# Patient Record
Sex: Female | Born: 1965 | Race: White | Hispanic: No | Smoking: Former smoker
Health system: Southern US, Community
[De-identification: ages and names within clinical notes are randomized; demographics above are authoritative.]

## PROBLEM LIST (undated history)

## (undated) DIAGNOSIS — I4891 Unspecified atrial fibrillation: Secondary | ICD-10-CM

## (undated) HISTORY — PX: TUBAL LIGATION: SHX77

## (undated) HISTORY — PX: CARDIAC ELECTROPHYSIOLOGY MAPPING AND ABLATION: SHX1292

---

## 2016-11-07 ENCOUNTER — Emergency Department (HOSPITAL_BASED_OUTPATIENT_CLINIC_OR_DEPARTMENT_OTHER)
Admission: EM | Admit: 2016-11-07 | Discharge: 2016-11-08 | Disposition: A | Discharge status: Home / Self Care | Payer: 59 | Attending: Emergency Medicine | Admitting: Emergency Medicine

## 2016-11-07 ENCOUNTER — Encounter (HOSPITAL_BASED_OUTPATIENT_CLINIC_OR_DEPARTMENT_OTHER): Payer: Self-pay | Admitting: Emergency Medicine

## 2016-11-07 ENCOUNTER — Emergency Department (HOSPITAL_BASED_OUTPATIENT_CLINIC_OR_DEPARTMENT_OTHER): Payer: 59

## 2016-11-07 DIAGNOSIS — R0789 Other chest pain: Secondary | ICD-10-CM | POA: Insufficient documentation

## 2016-11-07 DIAGNOSIS — Z87891 Personal history of nicotine dependence: Secondary | ICD-10-CM | POA: Diagnosis not present

## 2016-11-07 DIAGNOSIS — R079 Chest pain, unspecified: Secondary | ICD-10-CM

## 2016-11-07 HISTORY — DX: Unspecified atrial fibrillation: I48.91

## 2016-11-07 LAB — BASIC METABOLIC PANEL
Anion gap: 8 (ref 5–15)
BUN: 15 mg/dL (ref 6–20)
CO2: 26 mmol/L (ref 22–32)
CREATININE: 0.89 mg/dL (ref 0.44–1.00)
Calcium: 9 mg/dL (ref 8.9–10.3)
Chloride: 103 mmol/L (ref 101–111)
GFR calc Af Amer: >60 mL/min (ref >60)
GFR calc non Af Amer: >60 mL/min (ref >60)
GLUCOSE: 111 mg/dL — AB (ref 65–99)
POTASSIUM: 4 mmol/L (ref 3.5–5.1)
Sodium: 137 mmol/L (ref 135–145)

## 2016-11-07 LAB — TROPONIN I
Troponin I: <0.03 ng/mL (ref <0.03)
Troponin I: <0.03 ng/mL (ref <0.03)

## 2016-11-07 LAB — CBC
HEMATOCRIT: 41.7 % (ref 36.0–46.0)
Hemoglobin: 13.4 g/dL (ref 12.0–15.0)
MCH: 30 pg (ref 26.0–34.0)
MCHC: 32.1 g/dL (ref 30.0–36.0)
MCV: 93.3 fL (ref 78.0–100.0)
PLATELETS: 363 10*3/uL (ref 150–400)
RBC: 4.47 MIL/uL (ref 3.87–5.11)
RDW: 12.3 % (ref 11.5–15.5)
WBC: 8.1 10*3/uL (ref 4.0–10.5)

## 2016-11-07 MED ORDER — NITROGLYCERIN 0.4 MG SL SUBL
0.4000 mg | SUBLINGUAL_TABLET | SUBLINGUAL | Status: DC | PRN
  Administered 2016-11-07 (×2): 0.4 mg via SUBLINGUAL
  Filled 2016-11-07: qty 1

## 2016-11-07 MED ORDER — GI COCKTAIL ~~LOC~~
30.0000 mL | Freq: Once | ORAL | Status: AC
  Administered 2016-11-08: 30 mL via ORAL
  Filled 2016-11-07: qty 30

## 2016-11-07 MED ORDER — KETOROLAC TROMETHAMINE 60 MG/2ML IM SOLN: 60.0000 mg | Freq: Once | INTRAMUSCULAR | Status: DC

## 2016-11-07 MED ORDER — KETOROLAC TROMETHAMINE 30 MG/ML IJ SOLN
30.0000 mg | Freq: Once | INTRAMUSCULAR | Status: AC
  Administered 2016-11-07: 30 mg via INTRAVENOUS
  Filled 2016-11-07: qty 1

## 2016-11-07 NOTE — ED Provider Notes (Signed)
[facility] Provider Note   CSN: [id number] Arrival date & time: 11/07/16  1712  By signing my name below, I, Javier Docker, attest that this documentation has been prepared under the direction and in the presence of Rolan Bucco, MD. Electronically Signed: Javier Docker, ER Scribe. 05/30/2016. 9:08 PM.  History   Chief Complaint Chief Complaint  Patient presents with  . Chest Pain   The history is provided by the patient. No language interpreter was used.    HPI Comments: Jasmin Sullivan is a 51 y.o. female who presents to the Emergency Department complaining of CP with associated diffuse chest tightness and dizziness. She denies SOB, nausea, calf pain, recent surgery, long car rides/travel, hx of blood clots, vomiting or abdominal pain. She had one event of the same sx last weekend. She has a PMHx of cardiac ablation for afib. Nothing makes her sx better or worse. She denies cough, chest congestion or other cold sx. Her PCP is Dr. Julius Bowels. She has a past smoking hx but quit three years ago. She has NKDA. At last physical her cholesterol was slightly elevated.  No family hx of heart dz.   Past Medical History:  Diagnosis Date  . A-fib (HCC)     There are no active problems to display for this patient.   Past Surgical History:  Procedure Laterality Date  . CARDIAC ELECTROPHYSIOLOGY MAPPING AND ABLATION    . TUBAL LIGATION      OB History    No data available       Home Medications    Prior to Admission medications   Medication Sig Start Date End Date Taking? Authorizing Provider  buPROPion (WELLBUTRIN XL) 300 MG 24 hr tablet Take 300 mg by mouth daily.   Yes Historical Provider, MD    Family History History reviewed. No pertinent family history.  Social History Social History  Substance Use Topics  . Smoking status: Former Games developer  . Smokeless tobacco: Never Used  . Alcohol use 1.2 oz/week    2 Glasses of wine per week     Allergies   Patient  has no known allergies.   Review of Systems Review of Systems  Constitutional: Negative for chills, diaphoresis, fatigue and fever.  HENT: Negative for congestion, rhinorrhea and sneezing.   Eyes: Negative.   Respiratory: Positive for chest tightness. Negative for cough and shortness of breath.   Cardiovascular: Positive for chest pain. Negative for leg swelling.  Gastrointestinal: Negative for abdominal pain, blood in stool, diarrhea, nausea and vomiting.  Genitourinary: Negative for difficulty urinating, flank pain, frequency and hematuria.  Musculoskeletal: Negative for arthralgias and back pain.  Skin: Negative for rash.  Neurological: Negative for dizziness, speech difficulty, weakness, numbness and headaches.     Physical Exam Updated Vital Signs BP 126/83   Pulse (!) 56   Temp 98.2 F (36.8 C) (Oral)   Resp 12   Ht 5\' 9"  (1.753 m)   Wt 192 lb (87.1 kg)   SpO2 97%   BMI 28.35 kg/m   Physical Exam  Constitutional: She is oriented to person, place, and time. She appears well-developed and well-nourished.  HENT:  Head: Normocephalic and atraumatic.  Eyes: Pupils are equal, round, and reactive to light.  Neck: Normal range of motion. Neck supple.  Cardiovascular: Normal rate, regular rhythm and normal heart sounds.   Pulmonary/Chest: Effort normal and breath sounds normal. No respiratory distress. She has no wheezes. She has no rales. She exhibits tenderness.  Mild  TTP in left chest wall. No lower extremity TTP or edema.  Abdominal: Soft. Bowel sounds are normal. There is no tenderness. There is no rebound and no guarding.  Musculoskeletal: Normal range of motion. She exhibits no edema.  Lymphadenopathy:    She has no cervical adenopathy.  Neurological: She is alert and oriented to person, place, and time.  Skin: Skin is warm and dry. No rash noted.  Psychiatric: She has a normal mood and affect.     ED Treatments / Results  DIAGNOSTIC STUDIES: Oxygen Saturation  is 100% on RA, normal by my interpretation.    COORDINATION OF CARE: 9:08 PM Discussed treatment plan with pt at bedside and pt agreed to plan.  Labs (all labs ordered are listed, but only abnormal results are displayed) Labs Reviewed  BASIC METABOLIC PANEL - Abnormal; Notable for the following:       Result Value   Glucose, Bld 111 (*)    All other components within normal limits  CBC  TROPONIN I  TROPONIN I    EKG  EKG Interpretation  Date/Time:  Saturday November 07 2016 17:22:45 EST Ventricular Rate:  72 PR Interval:  152 QRS Duration: 90 QT Interval:  382 QTC Calculation: 418 R Axis:   -29 Text Interpretation:  Normal sinus rhythm Cannot rule out Anterior infarct , age undetermined Abnormal ECG No old tracing to compare Confirmed by Cree Kunert  MD, Nelle Sayed ([id number]) on 11/07/2016 5:42:39 PM       Radiology Dg Chest 2 View  Result Date: 11/07/2016 CLINICAL DATA:  Chest pain EXAM: CHEST  2 VIEW COMPARISON:  None. FINDINGS: The heart size and mediastinal contours are within normal limits. Both lungs are clear. The visualized skeletal structures are unremarkable. IMPRESSION: No active cardiopulmonary disease. Electronically Signed   By: Jasmine PangKim  Fujinaga M.D.   On: 11/07/2016 20:02    Procedures Procedures (including critical care time)  Medications Ordered in ED Medications  nitroGLYCERIN (NITROSTAT) SL tablet 0.4 mg (0.4 mg Sublingual Given 11/07/16 2336)  ketorolac (TORADOL) 30 MG/ML injection 30 mg (30 mg Intravenous Given 11/07/16 2223)  gi cocktail (Maalox,Lidocaine,Donnatal) (30 mLs Oral Given 11/08/16 0003)     Initial Impression / Assessment and Plan / ED Course  I have reviewed the triage vital signs and the nursing notes.  Pertinent labs & imaging results that were available during my care of the patient were reviewed by me and considered in my medical decision making (see chart for details).     Patient presents with chest pain. It is reproducible on palpation.  She has no ischemic changes on EKG. She's had 2 negative troponins. She has a heart score of 3. There is no symptoms that would be more suggestive of pulmonary embolus. No evidence of pneumonia or pneumothorax. She was discharged home in good condition. She was encouraged to have close follow-up with her PCP for further cardiac evaluation. Return precautions were given.  Final Clinical Impressions(s) / ED Diagnoses   Final diagnoses:  Nonspecific chest pain    New Prescriptions New Prescriptions   No medications on file      I personally performed the services described in this documentation, which was scribed in my presence.  The recorded information has been reviewed and considered.       Rolan BuccoMelanie Aroldo Galli, MD 11/08/16 [phone]

## 2016-11-07 NOTE — ED Notes (Signed)
Pt alert, NAD, calm, interactive, resps e/u, speaking in clear complete sentences, no dyspnea noted[facility]

## 2016-11-07 NOTE — ED Triage Notes (Signed)
Patient states that she started to have chest pain about an hour and half ago, with some dizziness. Patient reports that she has a history of ablation in the past for A.fib

## 2017-12-05 IMAGING — CR DG CHEST 2V
2 series · 2 of 2 positions shown · non-contrast
Comparison: None.

CLINICAL DATA: Chest pain

EXAM:
CHEST  2 VIEW

[w chest pa]
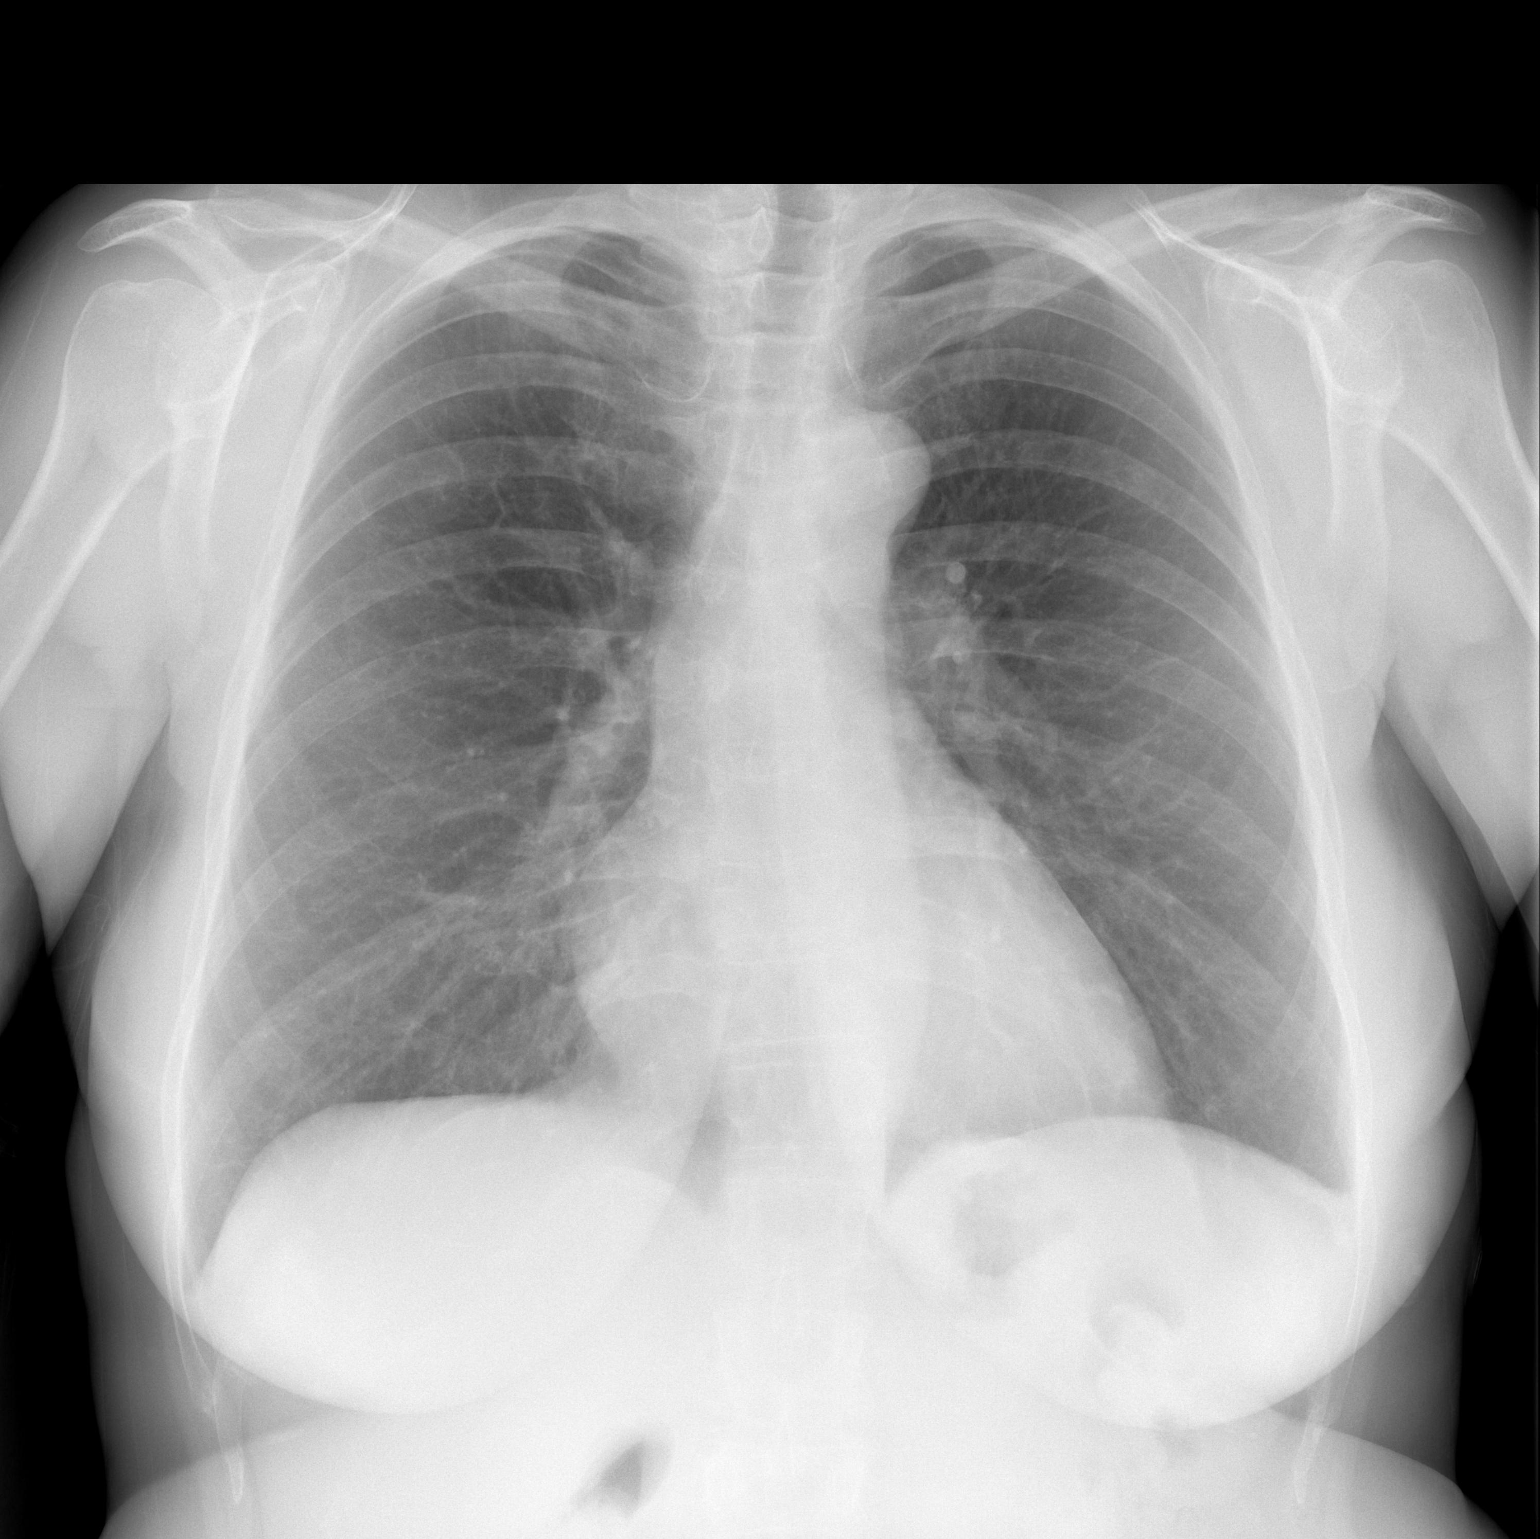

[w chest lat]
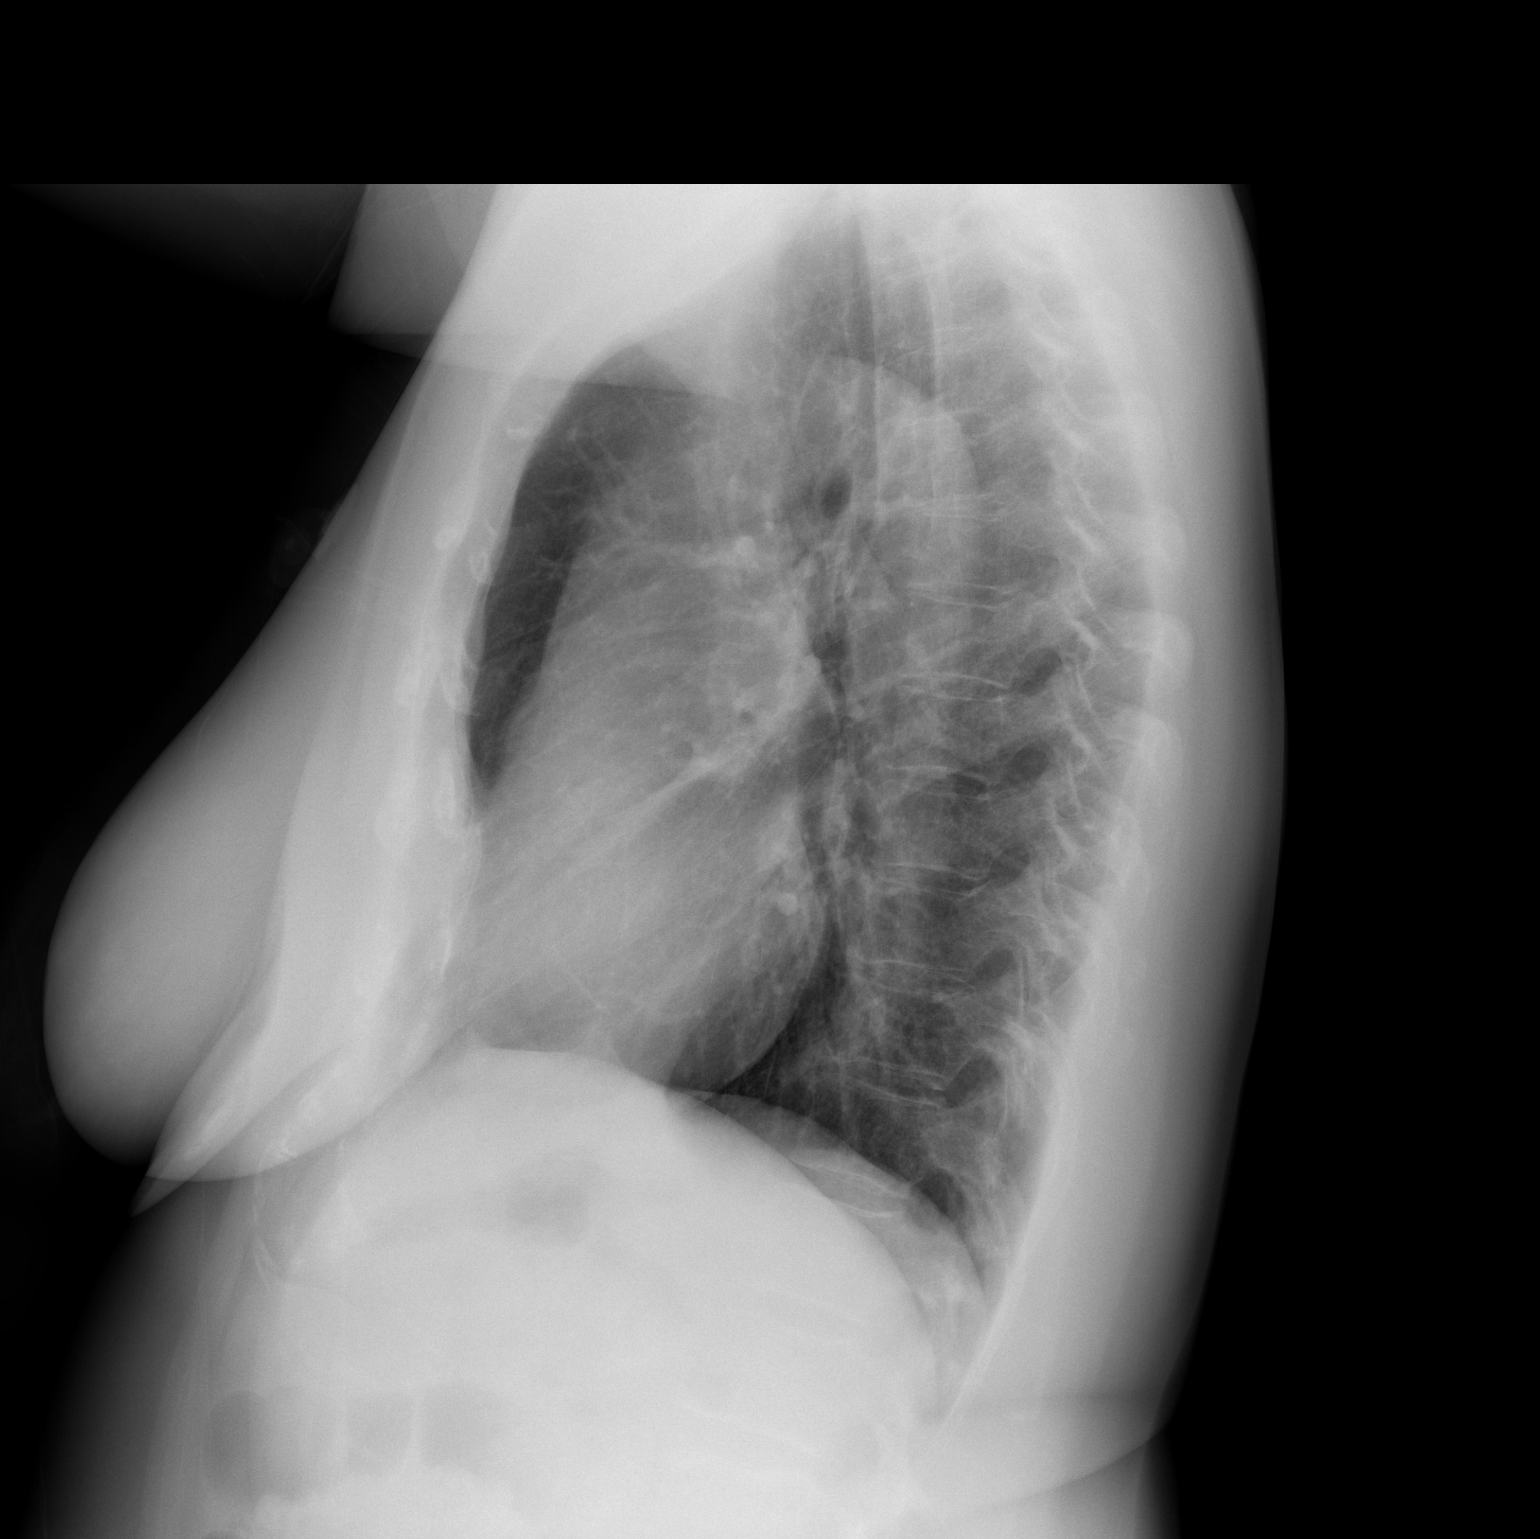

[2 of 2 positions shown; findings below may reference images not displayed]

FINDINGS: The heart size and mediastinal contours are within normal limits.
Both lungs are clear. The visualized skeletal structures are
unremarkable.
IMPRESSION: No active cardiopulmonary disease.
